# Patient Record
Sex: Female | Born: 1965 | Race: White | Hispanic: No | Marital: Single | Smoking: Never smoker
Health system: Southern US, Community
[De-identification: ages and names within clinical notes are randomized; demographics above are authoritative.]

## PROBLEM LIST (undated history)

## (undated) DIAGNOSIS — E785 Hyperlipidemia, unspecified: Secondary | ICD-10-CM

## (undated) DIAGNOSIS — G43909 Migraine, unspecified, not intractable, without status migrainosus: Secondary | ICD-10-CM

## (undated) DIAGNOSIS — M1990 Unspecified osteoarthritis, unspecified site: Secondary | ICD-10-CM

## (undated) DIAGNOSIS — K219 Gastro-esophageal reflux disease without esophagitis: Secondary | ICD-10-CM

## (undated) DIAGNOSIS — M549 Dorsalgia, unspecified: Secondary | ICD-10-CM

## (undated) DIAGNOSIS — E079 Disorder of thyroid, unspecified: Secondary | ICD-10-CM

## (undated) DIAGNOSIS — E119 Type 2 diabetes mellitus without complications: Secondary | ICD-10-CM

## (undated) HISTORY — PX: KNEE ARTHROSCOPY: SUR90

## (undated) HISTORY — PX: BACK SURGERY: SHX140

## (undated) HISTORY — DX: Gastro-esophageal reflux disease without esophagitis: K21.9

## (undated) HISTORY — DX: Unspecified osteoarthritis, unspecified site: M19.90

## (undated) HISTORY — DX: Migraine, unspecified, not intractable, without status migrainosus: G43.909

## (undated) HISTORY — DX: Hyperlipidemia, unspecified: E78.5

## (undated) HISTORY — DX: Dorsalgia, unspecified: M54.9

## (undated) HISTORY — DX: Disorder of thyroid, unspecified: E07.9

## (undated) HISTORY — DX: Type 2 diabetes mellitus without complications: E11.9

---
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
## 1992-02-02 DIAGNOSIS — G43909 Migraine, unspecified, not intractable, without status migrainosus: Secondary | ICD-10-CM

## 1992-02-02 HISTORY — DX: Migraine, unspecified, not intractable, without status migrainosus: G43.909

## 1996-02-02 DIAGNOSIS — E079 Disorder of thyroid, unspecified: Secondary | ICD-10-CM

## 1996-02-02 HISTORY — DX: Disorder of thyroid, unspecified: E07.9

## 1998-02-01 DIAGNOSIS — E785 Hyperlipidemia, unspecified: Secondary | ICD-10-CM

## 1998-02-01 HISTORY — DX: Hyperlipidemia, unspecified: E78.5

## 1999-05-06 ENCOUNTER — Encounter: Admission: RE | Admit: 1999-05-06 | Discharge: 1999-05-06 | Payer: Self-pay | Admitting: Family Medicine

## 1999-05-06 ENCOUNTER — Encounter: Payer: Self-pay | Admitting: Family Medicine

## 1999-06-15 ENCOUNTER — Encounter: Payer: Self-pay | Admitting: Family Medicine

## 1999-06-15 ENCOUNTER — Encounter: Admission: RE | Admit: 1999-06-15 | Discharge: 1999-06-15 | Payer: Self-pay | Admitting: Family Medicine

## 1999-08-12 ENCOUNTER — Other Ambulatory Visit: Admission: RE | Admit: 1999-08-12 | Discharge: 1999-08-12 | Payer: Self-pay | Admitting: Family Medicine

## 2000-02-16 ENCOUNTER — Other Ambulatory Visit: Admission: RE | Admit: 2000-02-16 | Discharge: 2000-02-16 | Payer: Self-pay | Admitting: Obstetrics and Gynecology

## 2000-03-22 ENCOUNTER — Other Ambulatory Visit: Admission: RE | Admit: 2000-03-22 | Discharge: 2000-03-22 | Payer: Self-pay | Admitting: Obstetrics and Gynecology

## 2000-08-01 ENCOUNTER — Other Ambulatory Visit: Admission: RE | Admit: 2000-08-01 | Discharge: 2000-08-01 | Payer: Self-pay | Admitting: Obstetrics and Gynecology

## 2000-08-02 ENCOUNTER — Other Ambulatory Visit: Admission: RE | Admit: 2000-08-02 | Discharge: 2000-08-02 | Payer: Self-pay | Admitting: Obstetrics and Gynecology

## 2001-05-30 ENCOUNTER — Encounter: Admission: RE | Admit: 2001-05-30 | Discharge: 2001-05-30 | Payer: Self-pay | Admitting: Family Medicine

## 2001-05-30 ENCOUNTER — Encounter: Payer: Self-pay | Admitting: Family Medicine

## 2001-07-14 ENCOUNTER — Observation Stay (HOSPITAL_COMMUNITY): Admission: RE | Admit: 2001-07-14 | Discharge: 2001-07-15 | Payer: Self-pay | Admitting: Neurosurgery

## 2001-07-14 ENCOUNTER — Encounter: Payer: Self-pay | Admitting: Neurosurgery

## 2001-09-05 ENCOUNTER — Other Ambulatory Visit: Admission: RE | Admit: 2001-09-05 | Discharge: 2001-09-05 | Payer: Self-pay | Admitting: Obstetrics and Gynecology

## 2002-09-13 ENCOUNTER — Other Ambulatory Visit: Admission: RE | Admit: 2002-09-13 | Discharge: 2002-09-13 | Payer: Self-pay | Admitting: Obstetrics and Gynecology

## 2003-02-05 ENCOUNTER — Encounter: Admission: RE | Admit: 2003-02-05 | Discharge: 2003-02-05 | Payer: Self-pay | Admitting: Family Medicine

## 2004-02-02 HISTORY — PX: CHOLECYSTECTOMY: SHX55

## 2004-02-19 ENCOUNTER — Other Ambulatory Visit: Admission: RE | Admit: 2004-02-19 | Discharge: 2004-02-19 | Payer: Self-pay | Admitting: Obstetrics and Gynecology

## 2004-08-10 ENCOUNTER — Other Ambulatory Visit: Admission: RE | Admit: 2004-08-10 | Discharge: 2004-08-10 | Payer: Self-pay | Admitting: Obstetrics and Gynecology

## 2004-08-14 ENCOUNTER — Ambulatory Visit (HOSPITAL_COMMUNITY): Admission: RE | Admit: 2004-08-14 | Discharge: 2004-08-14 | Payer: Self-pay | Admitting: Obstetrics and Gynecology

## 2005-05-19 ENCOUNTER — Other Ambulatory Visit: Admission: RE | Admit: 2005-05-19 | Discharge: 2005-05-19 | Payer: Self-pay | Admitting: Obstetrics and Gynecology

## 2005-05-25 ENCOUNTER — Ambulatory Visit (HOSPITAL_COMMUNITY): Admission: RE | Admit: 2005-05-25 | Discharge: 2005-05-25 | Payer: Self-pay | Admitting: Obstetrics and Gynecology

## 2005-11-05 ENCOUNTER — Ambulatory Visit (HOSPITAL_COMMUNITY): Admission: RE | Admit: 2005-11-05 | Discharge: 2005-11-05 | Payer: Self-pay | Admitting: Obstetrics and Gynecology

## 2006-02-01 DIAGNOSIS — E119 Type 2 diabetes mellitus without complications: Secondary | ICD-10-CM

## 2006-02-01 HISTORY — DX: Type 2 diabetes mellitus without complications: E11.9

## 2010-02-22 ENCOUNTER — Encounter: Payer: Self-pay | Admitting: Obstetrics and Gynecology

## 2013-10-25 ENCOUNTER — Other Ambulatory Visit: Payer: Self-pay | Admitting: Family Medicine

## 2013-10-25 ENCOUNTER — Ambulatory Visit
Admission: RE | Admit: 2013-10-25 | Discharge: 2013-10-25 | Disposition: A | Discharge status: Home / Self Care | Payer: BC Managed Care – PPO | Source: Ambulatory Visit | Attending: Family Medicine | Admitting: Family Medicine

## 2013-10-25 DIAGNOSIS — M25511 Pain in right shoulder: Secondary | ICD-10-CM

## 2014-05-03 ENCOUNTER — Encounter: Payer: Self-pay | Admitting: Internal Medicine

## 2014-07-16 ENCOUNTER — Encounter (INDEPENDENT_AMBULATORY_CARE_PROVIDER_SITE_OTHER): Payer: Self-pay

## 2014-07-16 ENCOUNTER — Ambulatory Visit (INDEPENDENT_AMBULATORY_CARE_PROVIDER_SITE_OTHER): Payer: BLUE CROSS/BLUE SHIELD | Admitting: Internal Medicine

## 2014-07-16 ENCOUNTER — Encounter: Payer: Self-pay | Admitting: Internal Medicine

## 2014-07-16 VITALS — BP 136/74 | HR 84 | Ht 63.25 in | Wt 161.0 lb

## 2014-07-16 DIAGNOSIS — Z8371 Family history of colonic polyps: Secondary | ICD-10-CM | POA: Diagnosis not present

## 2014-07-16 DIAGNOSIS — Z1211 Encounter for screening for malignant neoplasm of colon: Secondary | ICD-10-CM

## 2014-07-16 NOTE — Progress Notes (Signed)
Monica Willis 01/07/1966 [id number]  Note: This dictation was prepared with Dragon digital system. Any transcriptional errors that result from this procedure are unintentional.   History of Present Illness: This is a 49 year old white female patient of Dr. Duaine Dredge to discuss  her first screening colonoscopy. Her father Nandhini Shwartz is our patient who had an adenomatous polyp removed from sigmoid colon: In 2010. There is no family history of colorectal cancer. Patient is a diabetic. She is having regular bowel movements and denies rectal bleeding. Her hemoglobin is 13.1.    Past Medical History  Diagnosis Date  . Migraines 1994    gets once a month  . Diabetes 2008    type II  . Hyperlipidemia 2000  . Thyroid disease 1998    Past Surgical History  Procedure Laterality Date  . Cholecystectomy  2006  . Subdural hematoma evacuation via craniotomy  1975  . Knee arthroscopy Right     No Known Allergies  Family history and social history have been reviewed.  Review of Systems: Denies constipation or diarrhea. Denies rectal bleeding  The remainder of the 10 point ROS is negative except as outlined in the H&P  Physical Exam: General Appearance Well developed, in no distress Eyes  Non icteric  HEENT  Non traumatic, normocephalic  Mouth No lesion, tongue papillated, no cheilosis Neck Supple without adenopathy, thyroid not enlarged, no carotid bruits, no JVD Lungs Clear to auscultation bilaterally COR Normal S1, normal S2, regular rhythm, no murmur, quiet precordium Abdomen soft nontender with normoactive bowel sounds. Liver edge slightly below right costal margin. Nontender. No tympany Rectal soft Hemoccult-negative stool Extremities  No pedal edema Skin No lesions Neurological Alert and oriented x 3 Psychological Normal mood and affect  Assessment and Plan:   49 year old white female with average risk for colon cancer. Positive family history of colon polyp in her father who had  an adenoma removed in his 71s. According to the guidelines for  colorectal screening she will be due for screening  colonoscopy at age 53 which will be in February 2019. Encourage her to follow carb modified high-fiber diet  And  follow-up with Dr. Duaine Dredge. We will see her in 3 years ( recall letter generated)    Lina Sar 07/16/2014

## 2014-07-16 NOTE — Patient Instructions (Addendum)
You have been added to the recall system for 03/04/2017. You will receive a letter at that time. Call if you have any concerns before then.  Dr Duaine Dredge

## 2016-05-13 ENCOUNTER — Ambulatory Visit
Admission: RE | Admit: 2016-05-13 | Discharge: 2016-05-13 | Disposition: A | Discharge status: Home / Self Care | Payer: BLUE CROSS/BLUE SHIELD | Source: Ambulatory Visit | Attending: Family Medicine | Admitting: Family Medicine

## 2016-05-13 ENCOUNTER — Other Ambulatory Visit: Payer: Self-pay | Admitting: Family Medicine

## 2016-05-13 DIAGNOSIS — R053 Chronic cough: Secondary | ICD-10-CM

## 2016-05-13 DIAGNOSIS — R05 Cough: Secondary | ICD-10-CM

## 2016-08-11 ENCOUNTER — Ambulatory Visit
Admission: RE | Admit: 2016-08-11 | Discharge: 2016-08-11 | Disposition: A | Discharge status: Home / Self Care | Payer: BLUE CROSS/BLUE SHIELD | Source: Ambulatory Visit | Attending: Family Medicine | Admitting: Family Medicine

## 2016-08-11 ENCOUNTER — Other Ambulatory Visit: Payer: Self-pay | Admitting: Family Medicine

## 2016-08-11 DIAGNOSIS — M79672 Pain in left foot: Secondary | ICD-10-CM

## 2017-03-02 ENCOUNTER — Encounter: Payer: Self-pay | Admitting: Internal Medicine

## 2017-05-31 ENCOUNTER — Encounter: Payer: Self-pay | Admitting: Gastroenterology

## 2017-07-27 ENCOUNTER — Other Ambulatory Visit: Payer: Self-pay

## 2017-07-27 ENCOUNTER — Ambulatory Visit (AMBULATORY_SURGERY_CENTER): Payer: Self-pay

## 2017-07-27 VITALS — Ht 63.0 in | Wt 149.0 lb

## 2017-07-27 DIAGNOSIS — Z1211 Encounter for screening for malignant neoplasm of colon: Secondary | ICD-10-CM

## 2017-07-27 MED ORDER — NA SULFATE-K SULFATE-MG SULF 17.5-3.13-1.6 GM/177ML PO SOLN: 1.0000 | Freq: Once | ORAL | 0 refills | Status: AC

## 2017-07-27 NOTE — Progress Notes (Signed)
Denies allergies to eggs or soy products. Denies complication of anesthesia or sedation. Denies use of weight loss medication. Denies use of O2.   Emmi instructions declined.  

## 2017-08-10 ENCOUNTER — Other Ambulatory Visit: Payer: Self-pay

## 2017-08-10 ENCOUNTER — Ambulatory Visit (AMBULATORY_SURGERY_CENTER): Payer: BLUE CROSS/BLUE SHIELD | Admitting: Gastroenterology

## 2017-08-10 ENCOUNTER — Encounter: Payer: Self-pay | Admitting: Gastroenterology

## 2017-08-10 VITALS — BP 115/62 | HR 57 | Temp 98.0°F | Resp 11 | Ht 63.0 in | Wt 161.0 lb

## 2017-08-10 DIAGNOSIS — Z538 Procedure and treatment not carried out for other reasons: Secondary | ICD-10-CM | POA: Diagnosis not present

## 2017-08-10 DIAGNOSIS — Z1211 Encounter for screening for malignant neoplasm of colon: Secondary | ICD-10-CM | POA: Diagnosis present

## 2017-08-10 MED ORDER — SODIUM CHLORIDE 0.9 % IV SOLN: 500.0000 mL | INTRAVENOUS | Status: AC

## 2017-08-10 NOTE — Progress Notes (Signed)
Pt's states no medical or surgical changes since previsit or office visit. 

## 2017-08-10 NOTE — Progress Notes (Signed)
To PACU, VSS. Report to [email or web address] 

## 2017-08-10 NOTE — Patient Instructions (Signed)
YOU HAD AN ENDOSCOPIC PROCEDURE TODAY AT THE [facility]:   Refer to the procedure report that was given to you for any specific questions about what was found during the examination.  If the procedure report does not answer your questions, please call your gastroenterologist to clarify.  If you requested that your care partner not be given the details of your procedure findings, then the procedure report has been included in a sealed envelope for you to review at your convenience later.  YOU SHOULD EXPECT: Some feelings of bloating in the abdomen. Passage of more gas than usual.  Walking can help get rid of the air that was put into your GI tract during the procedure and reduce the bloating. If you had a lower endoscopy (such as a colonoscopy or flexible sigmoidoscopy) you may notice spotting of blood in your stool or on the toilet paper. If you underwent a bowel prep for your procedure, you may not have a normal bowel movement for a few days.  Please Note:  You might notice some irritation and congestion in your nose or some drainage.  This is from the oxygen used during your procedure.  There is no need for concern and it should clear up in a day or so.  SYMPTOMS TO REPORT IMMEDIATELY:   Following lower endoscopy (colonoscopy or flexible sigmoidoscopy):  Excessive amounts of blood in the stool  Significant tenderness or worsening of abdominal pains  Swelling of the abdomen that is new, acute  Fever of 100F or higher  Will schedule recall colon with different prep in 6 months to a year at your convenience.   For urgent or emergent issues, a gastroenterologist can be reached at any hour by calling [phone].   DIET:  We do recommend a small meal at first, but then you may proceed to your regular diet.  Drink plenty of fluids but you should avoid alcoholic beverages for 24 hours.  ACTIVITY:  You should plan to take it easy for the rest of today and you should NOT DRIVE or use  heavy machinery until tomorrow (because of the sedation medicines used during the test).    FOLLOW UP: Our staff will call the number listed on your records the next business day following your procedure to check on you and address any questions or concerns that you may have regarding the information given to you following your procedure. If we do not reach you, we will leave a message.  However, if you are feeling well and you are not experiencing any problems, there is no need to return our call.  We will assume that you have returned to your regular daily activities without incident.  If any biopsies were taken you will be contacted by phone or by letter within the next 1-3 weeks.  Please call us at [phone] if you have not heard about the biopsies in 3 weeks.    SIGNATURES/CONFIDENTIALITY: You and/or your care partner have signed paperwork which will be entered into your electronic medical record.  These signatures attest to the fact that that the information above on your After Visit Summary has been reviewed and is understood.  Full responsibility of the confidentiality of this discharge information lies with you and/or your care-partner.  Thank you for letting us take care of your healthcare needs today.

## 2017-08-10 NOTE — Op Note (Signed)
[facility] Patient Name: Monica Willis Procedure Date: 08/10/2017 8:26 AM MRN: [id number] Endoscopist: Viviann Spare P. Adela Lank , MD Age: 52 Referring MD:  Date of Birth: 1965/03/04 Gender: Female Account #: [id number] Procedure:                Colonoscopy Indications:              Screening for colorectal malignant neoplasm, This                            is the patient's first colonoscopy Medicines:                Monitored Anesthesia Care Procedure:                Pre-Anesthesia Assessment:                           - Prior to the procedure, a History and Physical                            was performed, and patient medications and                            allergies were reviewed. The patient's tolerance of                            previous anesthesia was also reviewed. The risks                            and benefits of the procedure and the sedation                            options and risks were discussed with the patient.                            All questions were answered, and informed consent                            was obtained. Prior Anticoagulants: The patient has                            taken no previous anticoagulant or antiplatelet                            agents. ASA Grade Assessment: II - A patient with                            mild systemic disease. After reviewing the risks                            and benefits, the patient was deemed in                            satisfactory condition to undergo the procedure.  After obtaining informed consent, the colonoscope                            was passed under direct vision. Throughout the                            procedure, the patient's blood pressure, pulse, and                            oxygen saturations were monitored continuously. The                            Model CF-HQ190L [id number]) scope was introduced                            through the anus and  advanced to the the cecum,                            identified by appendiceal orifice and ileocecal                            valve. The colonoscopy was performed without                            difficulty. The patient tolerated the procedure                            well. The quality of the bowel preparation was                            fair. The ileocecal valve, appendiceal orifice, and                            rectum were photographed. Scope In: 8:30:04 AM Scope Out: 8:44:50 AM Scope Withdrawal Time: 0 hours 10 minutes 25 seconds  Total Procedure Duration: 0 hours 14 minutes 46 seconds  Findings:                 The perianal and digital rectal examinations were                            normal.                           A large amount of liquid stool was found in the                            entire colon, making visualization difficult.                            Lavage of the area was performed using copious                            amounts of sterile water. Liquid stool could be  suctioned but adherent film of stool on the colon                            wall, particularly in the right colon, was                            difficult to remove. In this light bowel prep for                            this exam was only fair. Prep was adequate to rule                            out large mass lesions / polyps, but flat or small                            polyps may not have been appreciated, particularly                            in the right colon.                           Anal papilla(e) were hypertrophied.                           The exam was otherwise without abnormality. Complications:            No immediate complications. Estimated blood loss:                            None. Estimated Blood Loss:     Estimated blood loss: none. Impression:               - Preparation of the colon was fair as outlined -                             flat or small lesions may not have been appreciated.                           - Anal papilla(e) were hypertrophied.                           - The examination was otherwise normal. Recommendation:           - Patient has a contact number available for                            emergencies. The signs and symptoms of potential                            delayed complications were discussed with the                            patient. Return to normal activities tomorrow.  Written discharge instructions were provided to the                            patient.                           - Resume previous diet.                           - Continue present medications.                           - Repeat colonoscopy because the bowel preparation                            was suboptimal, at your convenience. Viviann SpareSteven P. Arian Mcquitty, MD 08/10/2017 8:51:03 AM This report has been signed electronically.

## 2017-08-11 ENCOUNTER — Telehealth: Payer: Self-pay

## 2017-08-11 NOTE — Telephone Encounter (Signed)
  Follow up Call-  Call back number 08/10/2017  Post procedure Call Back phone  # [phone]  Permission to leave phone message Yes  Some recent data might be hidden     Patient questions:  Do you have a fever, pain , or abdominal swelling? No. Pain Score  0 *  Have you tolerated food without any problems? Yes.    Have you been able to return to your normal activities? Yes.    Do you have any questions about your discharge instructions: Diet   No. Medications  No. Follow up visit  No.  Do you have questions or concerns about your Care? No.  Actions: * If pain score is 4 or above: No action needed, pain <4.

## 2017-10-27 ENCOUNTER — Other Ambulatory Visit: Payer: Self-pay | Admitting: Family Medicine

## 2017-10-27 ENCOUNTER — Ambulatory Visit
Admission: RE | Admit: 2017-10-27 | Discharge: 2017-10-27 | Disposition: A | Discharge status: Home / Self Care | Payer: BLUE CROSS/BLUE SHIELD | Source: Ambulatory Visit | Attending: Family Medicine | Admitting: Family Medicine

## 2017-10-27 DIAGNOSIS — M79642 Pain in left hand: Secondary | ICD-10-CM

## 2018-02-03 ENCOUNTER — Other Ambulatory Visit: Payer: Self-pay | Admitting: Family Medicine

## 2018-02-03 ENCOUNTER — Ambulatory Visit
Admission: RE | Admit: 2018-02-03 | Discharge: 2018-02-03 | Disposition: A | Discharge status: Home / Self Care | Payer: BLUE CROSS/BLUE SHIELD | Source: Ambulatory Visit | Attending: Family Medicine | Admitting: Family Medicine

## 2018-02-03 DIAGNOSIS — R52 Pain, unspecified: Secondary | ICD-10-CM

## 2018-03-31 ENCOUNTER — Encounter: Payer: Self-pay | Admitting: Gastroenterology

## 2019-04-25 ENCOUNTER — Other Ambulatory Visit: Payer: Self-pay

## 2019-04-25 ENCOUNTER — Ambulatory Visit
Admission: RE | Admit: 2019-04-25 | Discharge: 2019-04-25 | Disposition: A | Discharge status: Home / Self Care | Payer: BC Managed Care – PPO | Source: Ambulatory Visit | Attending: Family Medicine | Admitting: Family Medicine

## 2019-04-25 ENCOUNTER — Other Ambulatory Visit: Payer: Self-pay | Admitting: Family Medicine

## 2019-04-25 DIAGNOSIS — R079 Chest pain, unspecified: Secondary | ICD-10-CM

## 2020-02-15 ENCOUNTER — Other Ambulatory Visit: Payer: Self-pay | Admitting: Family Medicine

## 2020-02-15 DIAGNOSIS — N8301 Follicular cyst of right ovary: Secondary | ICD-10-CM

## 2020-03-07 ENCOUNTER — Ambulatory Visit
Admission: RE | Admit: 2020-03-07 | Discharge: 2020-03-07 | Disposition: A | Discharge status: Home / Self Care | Payer: BLUE CROSS/BLUE SHIELD | Source: Ambulatory Visit | Attending: Family Medicine | Admitting: Family Medicine

## 2020-03-07 DIAGNOSIS — N8301 Follicular cyst of right ovary: Secondary | ICD-10-CM

## 2020-09-08 ENCOUNTER — Other Ambulatory Visit: Payer: Self-pay | Admitting: Family Medicine

## 2020-09-08 DIAGNOSIS — R922 Inconclusive mammogram: Secondary | ICD-10-CM

## 2020-09-18 ENCOUNTER — Other Ambulatory Visit: Payer: Self-pay

## 2020-09-18 ENCOUNTER — Ambulatory Visit
Admission: RE | Admit: 2020-09-18 | Discharge: 2020-09-18 | Disposition: A | Discharge status: Home / Self Care | Payer: No Typology Code available for payment source | Source: Ambulatory Visit | Attending: Family Medicine | Admitting: Family Medicine

## 2020-09-18 ENCOUNTER — Inpatient Hospital Stay
Admission: RE | Admit: 2020-09-18 | Discharge: 2020-09-18 | Disposition: A | Discharge status: Home / Self Care | Payer: Self-pay | Source: Ambulatory Visit | Attending: Family Medicine | Admitting: Family Medicine

## 2020-09-18 ENCOUNTER — Other Ambulatory Visit: Payer: Self-pay | Admitting: Family Medicine

## 2020-09-18 DIAGNOSIS — R922 Inconclusive mammogram: Secondary | ICD-10-CM

## 2020-09-18 MED ORDER — GADOBUTROL 1 MMOL/ML IV SOLN
8.0000 mL | Freq: Once | INTRAVENOUS | Status: AC | PRN
  Administered 2020-09-18: 8 mL via INTRAVENOUS

## 2020-09-19 ENCOUNTER — Other Ambulatory Visit: Payer: Self-pay | Admitting: Family Medicine

## 2020-09-19 DIAGNOSIS — R928 Other abnormal and inconclusive findings on diagnostic imaging of breast: Secondary | ICD-10-CM

## 2020-10-09 ENCOUNTER — Other Ambulatory Visit: Payer: Self-pay

## 2020-10-09 ENCOUNTER — Ambulatory Visit
Admission: RE | Admit: 2020-10-09 | Discharge: 2020-10-09 | Disposition: A | Discharge status: Home / Self Care | Payer: BLUE CROSS/BLUE SHIELD | Source: Ambulatory Visit | Attending: Family Medicine | Admitting: Family Medicine

## 2020-10-09 DIAGNOSIS — R928 Other abnormal and inconclusive findings on diagnostic imaging of breast: Secondary | ICD-10-CM

## 2023-05-07 IMAGING — MG MM BREAST LOCALIZATION CLIP
4 series · 4 of 12 positions shown · non-contrast
Comparison: Previous exams.

CLINICAL DATA: Post MRI guided biopsy of non mass enhancement in
the upper-outer left breast.

EXAM:
3D DIAGNOSTIC LEFT MAMMOGRAM POST ULTRASOUND BIOPSY

[L CC synth-2D]
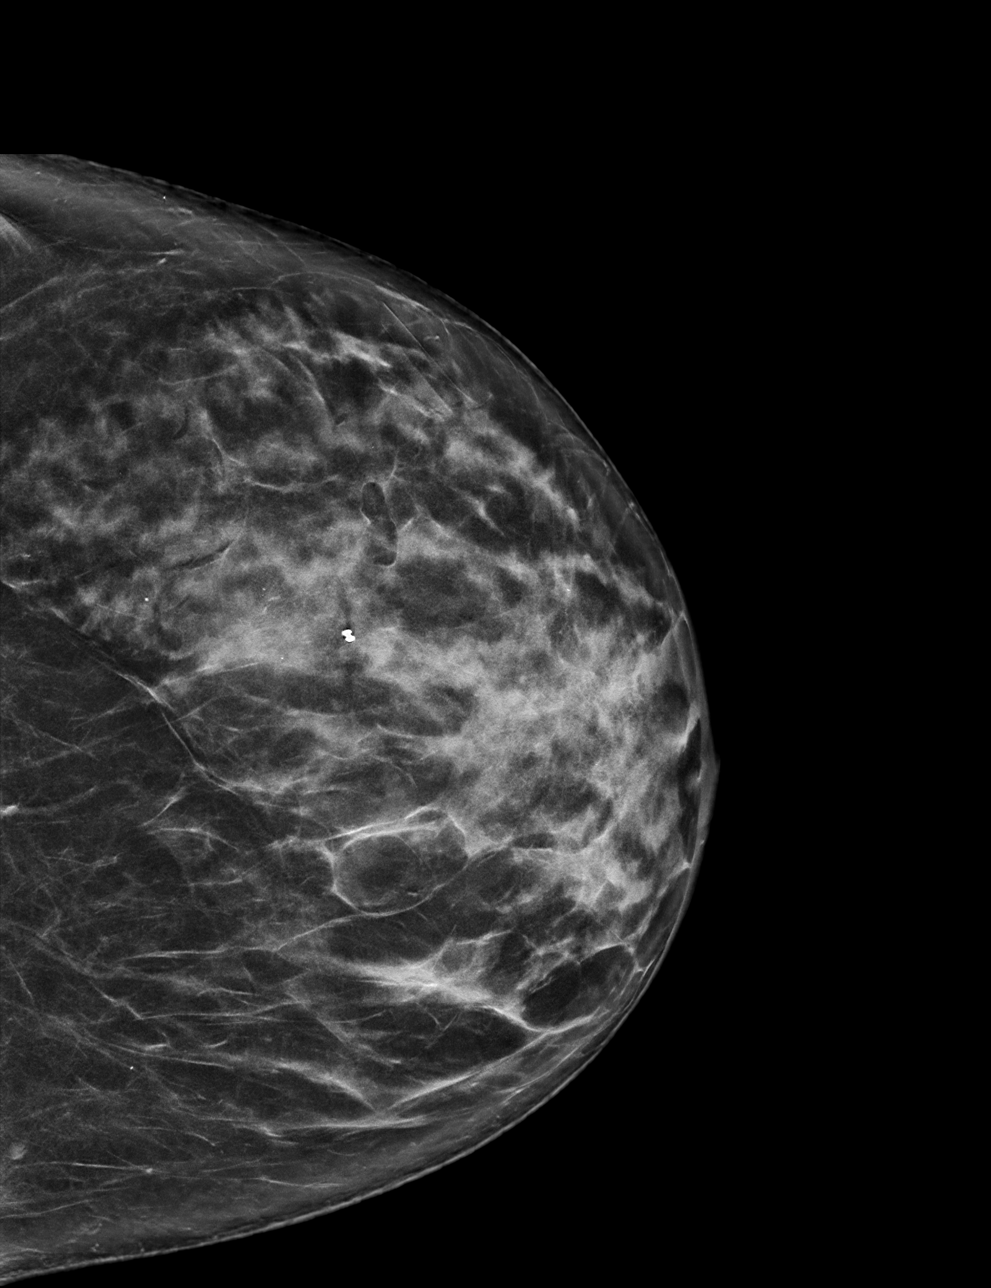

[L ML synth-2D]
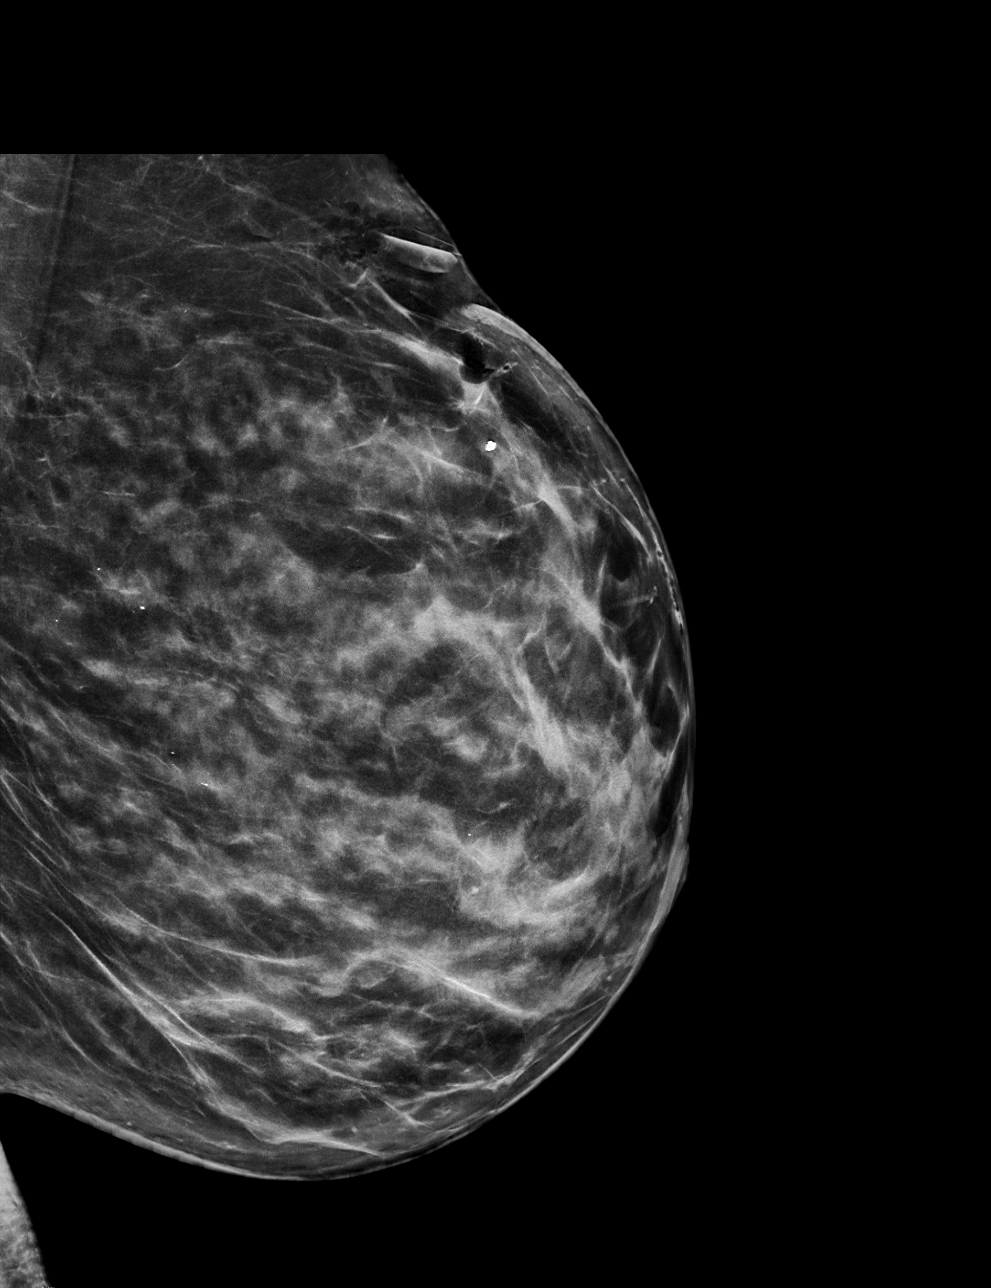

[L CC tomo · tomo slice 36/71.0]
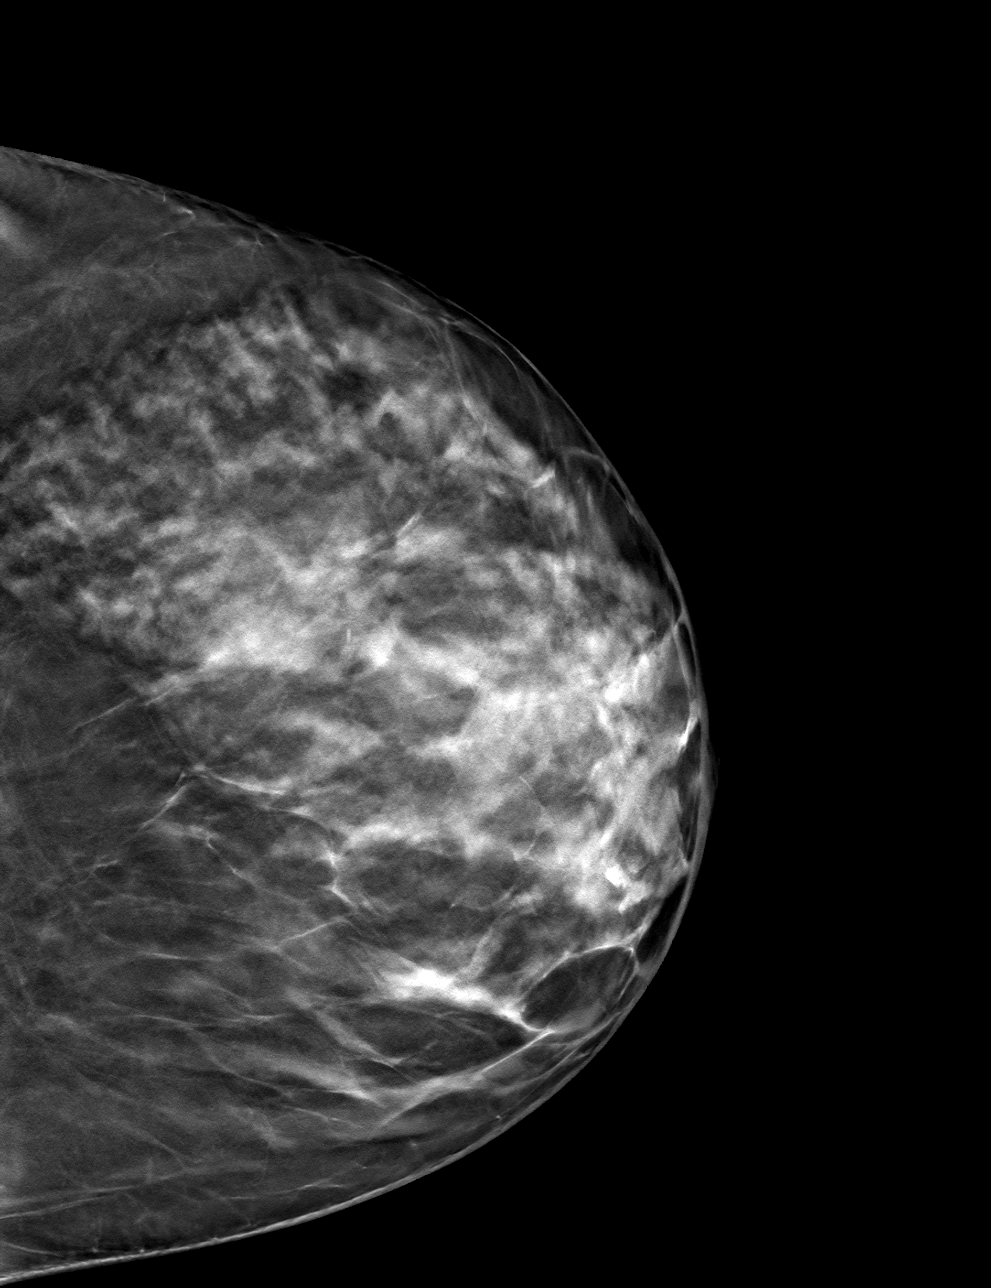

[L ML tomo · tomo slice 37/73.0]
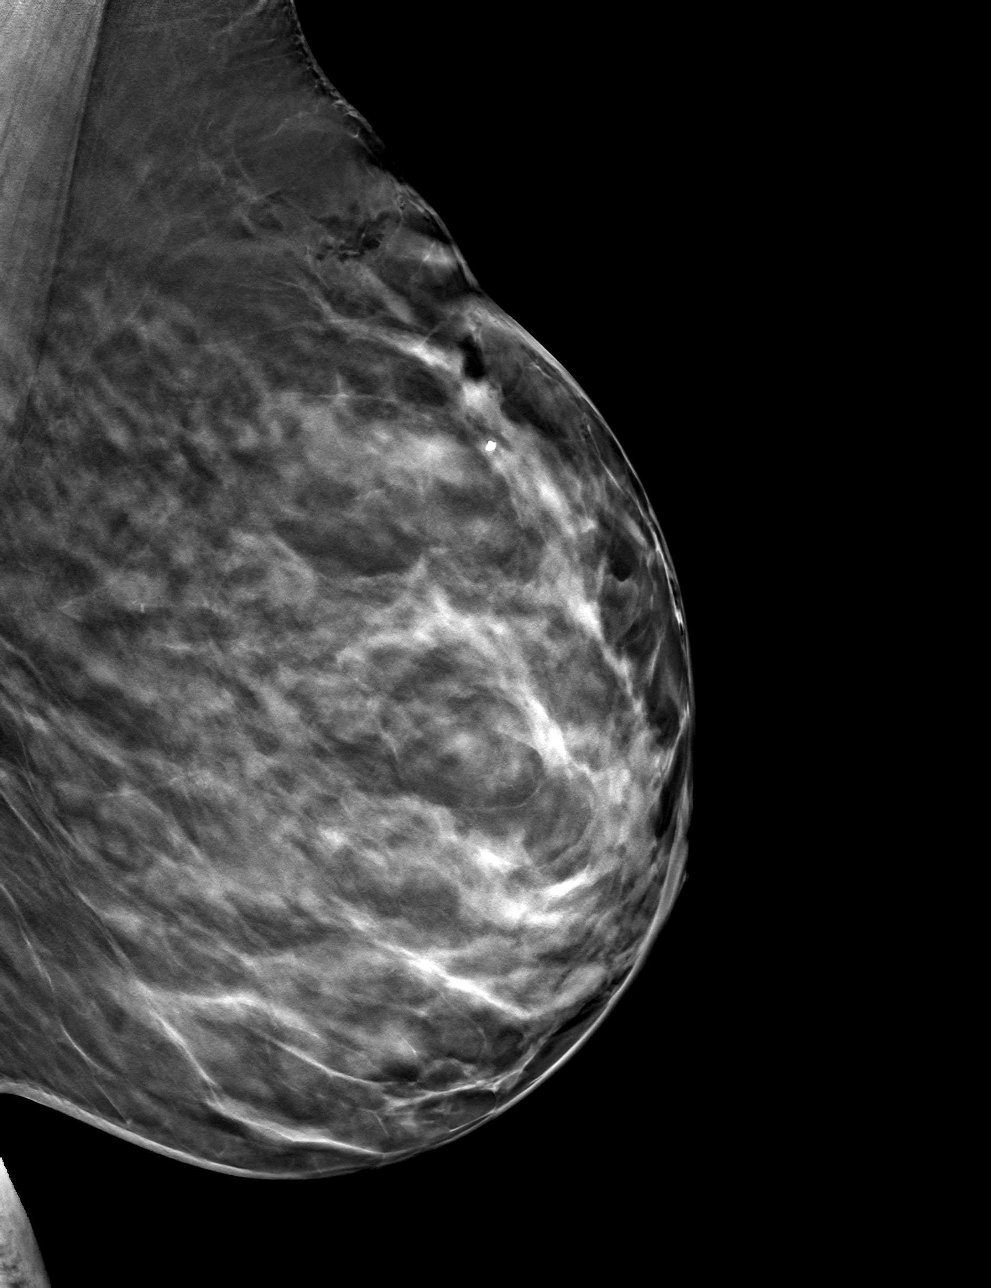

[4 of 12 positions shown; findings below may reference images not displayed]

FINDINGS: 3D Mammographic images were obtained following MRI guided biopsy of
non mass enhancement in the upper-outer left breast. A dumbbell
shaped biopsy marking clip is present the site of the biopsied non
mass enhancement in the upper-outer left breast.
IMPRESSION: Dumbbell shaped biopsy marking clip at site of biopsied non mass
enhancement in the upper-outer left breast.

Final Assessment: Post Procedure Mammograms for Marker Placement

## 2023-08-19 ENCOUNTER — Encounter: Payer: Self-pay | Admitting: Advanced Practice Midwife

## 2023-12-28 ENCOUNTER — Ambulatory Visit
Admission: RE | Admit: 2023-12-28 | Discharge: 2023-12-28 | Disposition: A | Source: Ambulatory Visit | Attending: Family Medicine | Admitting: Family Medicine

## 2023-12-28 ENCOUNTER — Other Ambulatory Visit: Payer: Self-pay | Admitting: Family Medicine

## 2023-12-28 DIAGNOSIS — M25561 Pain in right knee: Secondary | ICD-10-CM
